# Patient Record
Sex: Female | Born: 1955 | Race: White | Hispanic: No | Marital: Married | Smoking: Former smoker
Health system: Southern US, Community
[De-identification: ages and names within clinical notes are randomized; demographics above are authoritative.]

## PROBLEM LIST (undated history)

## (undated) DIAGNOSIS — K5909 Other constipation: Secondary | ICD-10-CM

## (undated) DIAGNOSIS — J309 Allergic rhinitis, unspecified: Secondary | ICD-10-CM

## (undated) DIAGNOSIS — I809 Phlebitis and thrombophlebitis of unspecified site: Secondary | ICD-10-CM

## (undated) DIAGNOSIS — Z8739 Personal history of other diseases of the musculoskeletal system and connective tissue: Secondary | ICD-10-CM

## (undated) DIAGNOSIS — Z8701 Personal history of pneumonia (recurrent): Secondary | ICD-10-CM

## (undated) DIAGNOSIS — IMO0002 Reserved for concepts with insufficient information to code with codable children: Secondary | ICD-10-CM

## (undated) DIAGNOSIS — I82890 Acute embolism and thrombosis of other specified veins: Secondary | ICD-10-CM

## (undated) DIAGNOSIS — I8390 Asymptomatic varicose veins of unspecified lower extremity: Secondary | ICD-10-CM

## (undated) DIAGNOSIS — Z872 Personal history of diseases of the skin and subcutaneous tissue: Secondary | ICD-10-CM

## (undated) HISTORY — DX: Reserved for concepts with insufficient information to code with codable children: IMO0002

## (undated) HISTORY — DX: Other constipation: K59.09

## (undated) HISTORY — DX: Allergic rhinitis, unspecified: J30.9

## (undated) HISTORY — DX: Phlebitis and thrombophlebitis of unspecified site: I80.9

## (undated) HISTORY — DX: Personal history of diseases of the skin and subcutaneous tissue: Z87.2

## (undated) HISTORY — DX: Personal history of pneumonia (recurrent): Z87.01

## (undated) HISTORY — DX: Personal history of other diseases of the musculoskeletal system and connective tissue: Z87.39

## (undated) HISTORY — DX: Asymptomatic varicose veins of unspecified lower extremity: I83.90

## (undated) HISTORY — PX: GANGLION CYST EXCISION: SHX1691

## (undated) HISTORY — DX: Acute embolism and thrombosis of other specified veins: I82.890

## (undated) HISTORY — PX: PILONIDAL CYST EXCISION: SHX744

---

## 1998-08-27 ENCOUNTER — Encounter: Payer: Self-pay | Admitting: Family Medicine

## 1998-08-27 ENCOUNTER — Ambulatory Visit (HOSPITAL_COMMUNITY): Admission: RE | Admit: 1998-08-27 | Discharge: 1998-08-27 | Payer: Self-pay | Admitting: Family Medicine

## 1998-09-12 ENCOUNTER — Ambulatory Visit (HOSPITAL_COMMUNITY): Admission: RE | Admit: 1998-09-12 | Discharge: 1998-09-12 | Payer: Self-pay | Admitting: Family Medicine

## 1998-09-12 ENCOUNTER — Encounter: Payer: Self-pay | Admitting: Family Medicine

## 1998-11-12 ENCOUNTER — Other Ambulatory Visit: Admission: RE | Admit: 1998-11-12 | Discharge: 1998-11-12 | Payer: Self-pay | Admitting: Family Medicine

## 2001-08-08 ENCOUNTER — Ambulatory Visit: Admission: RE | Admit: 2001-08-08 | Discharge: 2001-08-08 | Payer: Self-pay | Admitting: Internal Medicine

## 2004-04-10 ENCOUNTER — Ambulatory Visit: Payer: Self-pay | Admitting: Internal Medicine

## 2004-11-19 ENCOUNTER — Other Ambulatory Visit: Admission: RE | Admit: 2004-11-19 | Discharge: 2004-11-19 | Payer: Self-pay | Admitting: Obstetrics and Gynecology

## 2006-03-23 ENCOUNTER — Other Ambulatory Visit: Admission: RE | Admit: 2006-03-23 | Discharge: 2006-03-23 | Payer: Self-pay | Admitting: Obstetrics and Gynecology

## 2006-09-02 ENCOUNTER — Ambulatory Visit: Payer: Self-pay | Admitting: Family Medicine

## 2007-04-28 ENCOUNTER — Telehealth: Payer: Self-pay | Admitting: Family Medicine

## 2007-04-28 ENCOUNTER — Emergency Department (HOSPITAL_COMMUNITY): Admission: EM | Admit: 2007-04-28 | Discharge: 2007-04-28 | Payer: Self-pay | Admitting: Emergency Medicine

## 2008-04-05 ENCOUNTER — Ambulatory Visit: Payer: Self-pay | Admitting: Family Medicine

## 2008-04-05 DIAGNOSIS — M79609 Pain in unspecified limb: Secondary | ICD-10-CM

## 2008-04-05 DIAGNOSIS — J309 Allergic rhinitis, unspecified: Secondary | ICD-10-CM | POA: Insufficient documentation

## 2008-05-01 ENCOUNTER — Telehealth: Payer: Self-pay | Admitting: Family Medicine

## 2008-05-02 ENCOUNTER — Encounter: Payer: Self-pay | Admitting: Family Medicine

## 2008-05-02 ENCOUNTER — Ambulatory Visit: Admission: RE | Admit: 2008-05-02 | Discharge: 2008-05-02 | Payer: Self-pay | Admitting: Family Medicine

## 2008-09-10 ENCOUNTER — Telehealth: Payer: Self-pay | Admitting: Family Medicine

## 2009-04-15 ENCOUNTER — Ambulatory Visit: Payer: Self-pay | Admitting: Family Medicine

## 2009-04-29 ENCOUNTER — Encounter: Payer: Self-pay | Admitting: Family Medicine

## 2009-04-29 ENCOUNTER — Telehealth: Payer: Self-pay | Admitting: Family Medicine

## 2009-05-01 ENCOUNTER — Encounter: Payer: Self-pay | Admitting: Family Medicine

## 2009-05-01 ENCOUNTER — Ambulatory Visit: Payer: Self-pay

## 2009-06-16 ENCOUNTER — Encounter: Payer: Self-pay | Admitting: Family Medicine

## 2009-06-16 ENCOUNTER — Ambulatory Visit: Payer: Self-pay | Admitting: Vascular Surgery

## 2009-09-14 ENCOUNTER — Emergency Department (HOSPITAL_COMMUNITY): Admission: EM | Admit: 2009-09-14 | Discharge: 2009-09-14 | Payer: Self-pay | Admitting: Emergency Medicine

## 2009-09-14 ENCOUNTER — Ambulatory Visit: Payer: Self-pay | Admitting: Vascular Surgery

## 2009-09-14 ENCOUNTER — Encounter (INDEPENDENT_AMBULATORY_CARE_PROVIDER_SITE_OTHER): Payer: Self-pay | Admitting: Emergency Medicine

## 2009-09-16 ENCOUNTER — Ambulatory Visit: Payer: Self-pay | Admitting: Vascular Surgery

## 2009-09-26 ENCOUNTER — Ambulatory Visit: Payer: Self-pay | Admitting: Family Medicine

## 2009-09-26 DIAGNOSIS — R0602 Shortness of breath: Secondary | ICD-10-CM

## 2009-09-26 DIAGNOSIS — R5383 Other fatigue: Secondary | ICD-10-CM

## 2009-09-26 DIAGNOSIS — R5381 Other malaise: Secondary | ICD-10-CM

## 2009-09-26 DIAGNOSIS — R079 Chest pain, unspecified: Secondary | ICD-10-CM

## 2009-11-10 ENCOUNTER — Ambulatory Visit: Payer: Self-pay | Admitting: Vascular Surgery

## 2009-11-17 ENCOUNTER — Ambulatory Visit: Payer: Self-pay | Admitting: Vascular Surgery

## 2010-01-19 ENCOUNTER — Encounter: Payer: Self-pay | Admitting: Family Medicine

## 2010-06-04 NOTE — Consult Note (Signed)
Summary: [facility]  [facility]   Imported By: Maryln Gottron 07/14/2009 13:10:32  _____________________________________________________________________  External Attachment:    Type:   Image     Comment:   External Document

## 2010-06-04 NOTE — Assessment & Plan Note (Signed)
Summary: R SIDE FLANK / BACK PAIN? // RS   Vital Signs:  Patient profile:   55 year old female Weight:      224 pounds O2 Sat:      95 % on Room air Temp:     98.7 degrees F oral BP sitting:   120 / 84  (left arm) Cuff size:   regular  Vitals Entered By: Raechel Ache, RN (Sep 26, 2009 11:13 AM)  O2 Flow:  Room air CC: ER f/u- not feeling better. C/o R-flank/back pain, SOB, nausea, sweating.   History of Present Illness: Here to discuss a myriad of symptoms including SOB, chest pains, nausea, sweating, and occasional bouts of slow heartbeats. She was in the ER on 09-14-09 for chest pains, and her entire workup was normal. This included labs, EKG, and even a chest CT angiogram. Nothing specific came up. She feels the same today, and she describes sharp right sided chest pains. She had a full endocrinologic workup last year per Dr. Dorisann Frames, and nothing showed up then either. She goes into great detail today to tell me how frustrated and angry she is that no one can find out why she feels so bad all the time. When I suggested a possible etiology like fibromyalgia, she angrily dismissed this as a possibility. She does have a psychiatric appt. next week to see Dr. Evelene Croon.   Allergies: 1)  ! Sulfa 2)  ! Codeine  Past History:  Past Medical History: Allergic rhinitis chronic venous insufficiency of left leg with superficial thrombophlebitis, sees Dr. Josephina Gip  Past Surgical History: pilonidal cyst ganglion cyst on hand  Review of Systems  The patient denies anorexia, fever, weight loss, weight gain, vision loss, decreased hearing, hoarseness, syncope, peripheral edema, prolonged cough, headaches, hemoptysis, abdominal pain, melena, hematochezia, severe indigestion/heartburn, hematuria, incontinence, genital sores, suspicious skin lesions, transient blindness, difficulty walking, depression, unusual weight change, abnormal bleeding, enlarged lymph nodes, angioedema, breast  masses, and testicular masses.    Physical Exam  General:  Well-developed,well-nourished,in no acute distress; alert,appropriate and cooperative throughout examination Neck:  No deformities, masses, or tenderness noted. Chest Wall:  No deformities, masses, or tenderness noted. Lungs:  Normal respiratory effort, chest expands symmetrically. Lungs are clear to auscultation, no crackles or wheezes. Heart:  Normal rate and regular rhythm. S1 and S2 normal without gallop, murmur, click, rub or other extra sounds. Abdomen:  Bowel sounds positive,abdomen soft and non-tender without masses, organomegaly or hernias noted. Neurologic:  alert & oriented X3, cranial nerves II-XII intact, and gait normal.   Psych:  Oriented X3, memory intact for recent and remote, normally interactive, poor eye contact, moderately anxious, and angry.     Impression & Recommendations:  Problem # 1:  FATIGUE (ICD-780.79)  Orders: Rheumatology Referral (Rheumatology)  Problem # 2:  CHEST PAIN (ICD-786.50)  Problem # 3:  SHORTNESS OF BREATH (ICD-786.05)  Complete Medication List: 1)  Multivitamins Tabs (Multiple vitamin) .Marland Kitchen.. 1 by mouth once daily 2)  Calcium Carbonate-vitamin D 600-400 Mg-unit Tabs (Calcium carbonate-vitamin d) .Marland Kitchen.. 1 by mouth once daily 3)  Magnesium 200 Mg Tabs (Magnesium) .Marland Kitchen.. 1 by mouth once daily 4)  Ibuprofen 800 Mg Tabs (Ibuprofen) .Marland Kitchen.. 1 q 6 hours as needed pain  Patient Instructions: 1)  I had a long discussion with her about how it can be difficult to find a simple diagnosis when someone has a wide constellation of symptoms. One area that should be investigated would be connective tissue disorders, and  she does mention that her father has rheumatoid arthritis. We will refer her to Rheumatology for this. She can use Ibuprofen in the meantime.

## 2010-06-04 NOTE — Letter (Signed)
Summary: [facility]   Imported By: Maryln Gottron 03/13/2010 10:54:19  _____________________________________________________________________  External Attachment:    Type:   Image     Comment:   External Document

## 2010-07-20 LAB — BASIC METABOLIC PANEL
BUN: 16 mg/dL (ref 6–23)
CO2: 28 mEq/L (ref 19–32)
Chloride: 105 mEq/L (ref 96–112)
Creatinine, Ser: 0.87 mg/dL (ref 0.4–1.2)
GFR calc Af Amer: >60 mL/min (ref >60)
Potassium: 3.9 mEq/L (ref 3.5–5.1)

## 2010-07-20 LAB — POCT CARDIAC MARKERS
CKMB, poc: <1 ng/mL — ABNORMAL LOW (ref 1.0–8.0)
Myoglobin, poc: 42.4 ng/mL (ref 12–200)
Troponin i, poc: <0.05 ng/mL (ref 0.00–0.09)

## 2010-07-20 LAB — DIFFERENTIAL
Basophils Relative: 1 % (ref 0–1)
Eosinophils Absolute: 0.1 10*3/uL (ref 0.0–0.7)
Eosinophils Relative: 2 % (ref 0–5)
Lymphs Abs: 2.4 10*3/uL (ref 0.7–4.0)
Monocytes Relative: 7 % (ref 3–12)

## 2010-07-20 LAB — CBC
HCT: 41.8 % (ref 36.0–46.0)
MCHC: 33.2 g/dL (ref 30.0–36.0)
MCV: 90.1 fL (ref 78.0–100.0)
RBC: 4.63 MIL/uL (ref 3.87–5.11)
WBC: 6.1 10*3/uL (ref 4.0–10.5)

## 2010-09-15 NOTE — Assessment & Plan Note (Signed)
OFFICE VISIT   ELONDA, GIULIANO  DOB:  10-Oct-1955                                       11/17/2009  ZOXWR#:[id number]   The patient had laser ablation of the left great saphenous vein as well  as the lateral anterolateral branch of her great saphenous vein in both  of which had severe reflux up to and including the saphenofemoral  junction.  She returns today having had some moderate aching discomfort  in the groin and thigh area as one would expect as well as some mild  calf discomfort.  She has had no distal edema.  She has had some  bruising at her puncture sites where a few stab phlebectomies were  performed in the distal thigh.   She denies any chest pain, dyspnea on exertion, PND, orthopnea or any  other new specific symptoms.  She is increasing her ambulation and  continues to wear elastic stocking on the left leg.   PHYSICAL EXAMINATION:  On exam today blood pressure 112/70, heart rate  72, respirations 16.  There is no distal edema in the left leg.  She has  3+ dorsalis pedis pulse.  There is some moderate ecchymosis in the  distal thigh where the stab phlebectomy wounds were performed as well as  some mild tenderness over the great saphenous vein proximally.   Venous duplex exam was ordered by me today, I reviewed and interpreted  this.  There is no evidence of deep venous obstruction.  She does have  total closure of the left great saphenous vein including the  anterolateral branch.   In general I think she is getting along quite well and she will wear her  stocking for one more week.  Return to see [place] on a p.r.n. basis.  She is  quite pleased with her results.     Quita Skye Hart [place], M.D.  Electronically Signed   JDL/MEDQ  D:  11/17/2009  T:  11/18/2009  Job:  [id number]

## 2010-09-15 NOTE — Consult Note (Signed)
NEW PATIENT CONSULTATION   Margaret, Livingston  DOB:  1955/11/16                                       06/16/2009  CHART#:[id number]   Margaret Livingston is 55 year old female referred by Dr. Clent Ridges for severe venous  insufficiency of the left leg with painful varicosities and distal  edema.  She states that she has been having symptoms for the past 13  years with increasing discomfort particularly in the calf and ankle area  after standing.  This is not relieved by elevation and her job does not  allow her to do that.  She works as an Wellsite geologist in  a Doctor, hospital.  She has no history of deep venous  thrombosis, but had some episodes of some nodules in the ankle area  which were tender, possibly with some localized thrombophlebitis  although not documented.  She has had no history stasis ulcers or  bleeding, but does have swelling in the ankle as the day progresses.  She has been wearing long-leg (pantyhose 8-15 mm compression)  compression stockings since November with no improvement.  She is unable  to take ibuprofen because of GI side effects but does take stone root  which is an herb with some mild improvement in her symptoms.  She states  that this is affecting her ability to work because of the discomfort the  more she stands on her feet and also to do normal activities around the  house.   PAST MEDICAL HISTORY:  Chronic medical problems:  No diabetes,  hypertension, coronary artery disease, COPD, or stroke.  Previous  surgery includes:  1. Ganglion cyst, right first metatarsal head.  2. Pilonidal cyst surgery.   FAMILY HISTORY:  Positive varicose veins in her grandmother.  Coronary  artery disease in grandfather.  Negative for stroke.   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  She quit smoking 13 years ago, drinks occasional  wine.   REVIEW OF SYSTEMS:  Denies any weight loss, chest  pain, dyspnea on  exertion, PND, orthopnea.  No pulmonary, GI, or GU symptoms.  Denies any  hemiparesis, aphasia, TIAs, stroke, musculoskeletal symptoms.  All other  systems are negative on 12-point review of systems.   PHYSICAL EXAM:  Blood pressure 133/81, heart rate 60, respirations 16.  General:  She is a healthy-appearing, well-developed, well-nourished  female who is in no apparent distress.  HEENT:  EOMs intact,  conjunctivae normal.  Neck is supple, 3+ carotid pulses palpable.  No  bruits.  No cervical adenopathy.  Lungs are clear to auscultation.  Cardiovascular exam:  Regular rhythm.  No murmurs.  Abdomen:  Soft,  nontender with no masses.  Musculoskeletal exam reveals no major  deformities.  Skin is free of rashes.  Neurologic exam is normal.  Lower  extremity exam reveals 3+ femoral, popliteal, and dorsalis pedis pulses  bilaterally.  The right leg is relatively unremarkable with a few small  varicosities in the calf.  The left leg has large bulging varicosities  in the mid to distal thigh medially as well as more varicosities at the  knee level medially over the great saphenous vein extending down into  the pretibial area with severe reticular and spider veins covering the  lower ankle area anteriorly and medially, and laterally.  There  are no  active ulcers.  There is 1+ edema.   Venous duplex exam performed today revealed gross reflux in the large  lateral accessory branch to the great saphenous vein extending up to the  saphenofemoral junction and communicating with this large nest of  varicosities which then communicates back with the great saphenous vein  at the knee.  The proximal great saphenous vein medially is free of  reflux.   She is having painful varicosities secondary to reflux in the great  saphenous system.  We will treat her initially with long-leg elastic  compression stockings (20 mm-30 mm gradient) as well as elevation and  analgesics that she can  tolerate.  She will return in 3 months.  If  there have been no improvement, I think she should have following  procedures:  1. Laser ablation of the large lateral accessory branch of the great      saphenous vein with multiple stab phlebectomies.  2. Two courses of sclerotherapy.  3. The patient will return in 3 months.     Quita Skye Hart [place], M.D.  Electronically Signed   JDL/MEDQ  D:  06/16/2009  T:  06/17/2009  Job:  [id number]   cc:   Jeannett Senior A. Clent Ridges, MD

## 2010-09-15 NOTE — Assessment & Plan Note (Signed)
OFFICE VISIT   Margaret Livingston, Margaret Livingston  DOB:  [birth year]                                       09/16/2009  ZOXWR#:[id number]   The patient returns for further evaluation of her venous insufficiency  of the left leg.  She continues to have significant discomfort in the  left ankle posterior to the left saphenous vein and was seen in the  emergency department recently with ultrasound which revealed some  resolving thrombophlebitis in her distal great saphenous vein  apparently.  She was also having some chest discomfort for a few days  and she thought she had had a pulmonary embolus but the CT scan did not  reveal that.  She has no history of DVT or evidence of recent DVT.  She  does continue to have pain in the ankle area as well as discomfort in  her bulging varicosities in the distal thigh.  She has worn long leg  elastic compression stockings for 3 months and has also tried elevation  and analgesics (ibuprofen) without improvement.  Her symptoms are  continuing to affect her daily living.  She has a documented gross  reflux in the large lateral accessory branch of the great saphenous vein  extending up to the saphenofemoral junction which communicates with a  large nest of varicosities in the thigh and back with the great  saphenous vein at the knee.   CHRONIC MEDICAL PROBLEMS:  No diabetes, hypertension, coronary artery  disease, COPD or stroke.  She has had previous surgery for ganglion cyst  in the right first metatarsal head.   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  Quit smoking 13 years ago.   REVIEW OF SYSTEMS:  Is negative for chest pain, dyspnea on exertion,  PND, orthopnea.  No hemiparesis, aphasia, amaurosis fugax.  Denies any  lower extremity symptoms other than the left leg.   PHYSICAL EXAM:  Vital signs:  Blood pressure is 121/74, heart rate 57,  temperature 98.  General:  This is a  well-developed, well-nourished  female in no apparent distress, alert and oriented x3.  Neck:  Supple,  3+ carotid pulses palpable.  Abdomen:  Soft, nontender with no masses.  Lower extremities:  Exam reveals bulging varicosities in the left distal  thigh medially.  She has some spider and reticular veins the lower third  of the leg and ankle on the left with some tenderness posterior to the  medial malleolus.  Saphenous vein is somewhat thickened at the ankle  level.  She has no distal edema, has 2-3+ dorsalis pedis pulse palpable.   Today I visualized this with a SonoSite and she does have a patent large  lateral accessory branch with reflux in the proximal third and half of  the thigh communicating with varicosities.  I offered her the option of  laser ablation of the left great saphenous vein with multiple stab  phlebectomies to help treat her painful varicosities in the thigh and  also sclerotherapy for the reticular and spider veins in the ankle area.  She does not want to proceed with that at the present time although she  is continuing to have symptoms.  She will return to see [place] if her  symptoms do not resolve.     Quita Skye Hart [place], M.D.  Electronically  Signed   JDL/MEDQ  D:  09/16/2009  T:  09/17/2009  Job:  [id number]

## 2010-09-15 NOTE — Assessment & Plan Note (Signed)
OFFICE VISIT   Margaret Livingston, Margaret Livingston  DOB:  March 03, 1956                                       11/10/2009  CHART#:[id number]   The patient had laser ablation of 2 vessels in the left leg today for  venous hypertension with painful varicosities.  She has reflux through a  large lateral anterior branch which communicates with the great  saphenous vein which also has significant reflux over the proximal 10 to  20 cm communicating at the saphenofemoral junction.  I visualized these  both on SonoSite and performed laser ablation of these 2 separate  vessels today without complication.  We also performed 3 stab  phlebectomies of the distal thigh of large bulging varicosities.  The  patient tolerated the procedure well and will return in 1 week for  venous duplex exam to confirm closure of the great saphenous system and  the anterolateral accessory branch.     Quita Skye Hart [place], M.D.  Electronically Signed   JDL/MEDQ  D:  11/10/2009  T:  11/11/2009  Job:  [id number]

## 2010-09-15 NOTE — Procedures (Signed)
LOWER EXTREMITY VENOUS REFLUX EXAM   INDICATION:  Left lower extremity pain and swelling.   EXAM:  Using color-flow imaging and pulse Doppler spectral analysis, the  left common femoral, superficial femoral, popliteal, posterior tibial,  greater and lesser saphenous veins are evaluated.  There left is  evidence suggesting deep venous insufficiency throughout the left  femoral popliteal venous system.   The left saphenofemoral junction is not competent with reflux of >500  milliseconds.  The left GSV is not competent with reflux of >500  milliseconds.   The left proximal short saphenous vein demonstrates competency.   GSV Diameter (used if found to be incompetent only)                                            Right    Left  Proximal Greater Saphenous Vein           cm       0.34 cm  Proximal-to-mid-thigh                     cm       0.3 cm  Mid thigh                                 cm       cm  Mid-distal thigh                          cm       cm  Distal thigh                              cm       0.52 cm  Knee                                      cm       0.54 cm   IMPRESSION:  1. Left greater saphenous vein reflux of >500 milliseconds is      identified as described above and on the attached worksheet.  2. The left greater saphenous vein is tortuous at the mid to distal      thigh level.  3. The left deep venous system is not competent with reflux of >500      milliseconds.  4. The left lesser saphenous vein is competent.          ___________________________________________  Quita Skye. Hart [place], M.D.   CH/MEDQ  D:  06/17/2009  T:  06/18/2009  Job:  [id number]

## 2010-09-15 NOTE — Procedures (Signed)
DUPLEX DEEP VENOUS EXAM - LOWER EXTREMITY   INDICATION:  Follow up 1 week status post greater saphenous vein  ablation.   HISTORY:  Edema:  no  Trauma/Surgery:  Left greater saphenous vein ablation  Pain:  yes  PE:  no  Previous DVT:  no  Anticoagulants:  no  Other:   DUPLEX EXAM:                CFV   SFV   PopV  PTV    GSV                R  L  R  L  R  L  R   L  R  L  Thrombosis    0  0     0     0      0     +  Spontaneous   +  +     +     +      +     0  Phasic        +  +     +     +      +     0  Augmentation  +  +     +     +      +     0  Compressible  +  +     +     +      +     0  Competent     +  +     +     +      +     0   Legend:  + - yes  o - no  p - partial  D - decreased   IMPRESSION:  There does not appear to be any evidence of deep vein  thrombosis noted in the left leg.  The left greater saphenous vein is  ablated proximal to mid thigh.    _____________________________  Quita Skye. Hart [place], M.D.   CB/MEDQ  D:  11/17/2009  T:  11/17/2009  Job:  [id number]

## 2010-09-18 NOTE — Assessment & Plan Note (Signed)
OFFICE VISIT   LATRICE, STORLIE  DOB:  21-Feb-1956                                       10/06/2009  EAVWU#:[id number]   ADDENDUM:  Addendum to the previous note of Sep 16, 2009.   The patient is a 55 year old female who I have evaluated for painful  varicosities in the left thigh in February of this year, referred by Dr.  Clent Ridges for venous insufficiency.  She returned in May and had documented  gross reflux in the large lateral branch of the left great saphenous  vein feeding a large nest of varicosities in the thigh as well as some  other smaller varicosities extending posteriorly into the thigh and  calf.  She has continued to have symptoms despite wearing long-leg  elastic compression stockings and trying elevation and pain medication  and is now ready to proceed with treatment for these painful  varicosities to eliminate her symptoms which are affecting her daily  living.  She was noted previously to have no deep venous obstruction and  had 2 to 3+ dorsalis pedis pulses palpable.   Because she has failed conservative management, I would recommend  proceeding with laser ablation of the left large lateral accessory  branch of the great saphenous vein with multiple stab phlebectomies to  be followed by two courses of sclerotherapy for the secondary smaller  varicosities and reticular veins.  We will proceed with precertification  for this to be performed in the near future.     Quita Skye Hart [place], M.D.  Electronically Signed   JDL/MEDQ  D:  10/06/2009  T:  10/07/2009  Job:  [id number]

## 2010-11-02 ENCOUNTER — Encounter (INDEPENDENT_AMBULATORY_CARE_PROVIDER_SITE_OTHER): Payer: Self-pay | Admitting: Vascular Surgery

## 2010-11-02 ENCOUNTER — Encounter (INDEPENDENT_AMBULATORY_CARE_PROVIDER_SITE_OTHER): Payer: BC Managed Care – PPO

## 2010-11-02 DIAGNOSIS — I83893 Varicose veins of bilateral lower extremities with other complications: Secondary | ICD-10-CM

## 2010-11-03 NOTE — Assessment & Plan Note (Signed)
OFFICE VISIT  [name], [name] DOB:  05/25/1955                                       11/02/2010 [id number]#:[id number]  The patient has been seen before in the office having undergone laser ablation in July of 2011 of not only her left great saphenous vein but also an anterolateral branch.  A duplex exam 1 week later revealed closure of both branches in the mid to proximal thigh and no DVT.  A few months ago she began having some aching discomfort behind the left knee and also occasionally cramping in the left calf with ambulation and also at night.  She also had developed more prominent spider and reticular veins in the lower thigh.  She has no history of DVT or thrombophlebitis.  CHRONIC MEDICAL PROBLEMS:  None.  Denies diabetes, hypertension, coronary artery disease, COPD or stroke.  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  PHYSICAL EXAMINATION:  Vital signs:  Blood pressure is 105/70, heart rate is 56, respirations 12.  General:  She is a well-developed, well- nourished female who is in no apparent distress, alert and oriented x3. HEENT:  Exam normal for age.  Lower extremities:  Exam reveals 3+ femoral, popliteal and dorsalis pedis pulses palpable bilaterally.  Left leg has no bulging varicosities.  There is a large pattern of reticular and spider veins in the distal thigh medially.  No distal edema is noted.  There is no hyperpigmentation or ulceration.  Today I ordered a lower extremity venous duplex exam on the left which I reviewed and interpreted.  There is no DVT.  The left GSV is small and has no reflux.  The superficial femoral and popliteal veins in the deep system are incompetent.  There is a branch off of the distal great saphenous vein at the thigh level which gives off some varicose branches and the small saphenous vein is competent.  I do not think she needs  any further laser treatment but the only treatment at this point would be sclerotherapy of the secondary reticular and spider veins.  Margaret Livingston will get in touch with her next week when she returns to discuss the specifics of this and the patient will then make a decision about whether to proceed.    Margaret Livingston, M.D. Electronically Signed  JDL/MEDQ  D:  11/02/2010  T:  11/03/2010  Job:  [id number]

## 2010-11-09 NOTE — Procedures (Unsigned)
DUPLEX DEEP VENOUS EXAM - LOWER EXTREMITY  INDICATION:  Followup left GSV ablation 1 year ago, now with pain and swelling.  HISTORY:  Edema:  Yes. Trauma/Surgery:  GSV ablation. Pain:  Yes. PE:  No. Previous DVT:  No. Anticoagulants:  No. Other:  DUPLEX EXAM:               CFV   SFV   PopV  PTV    GSV               R  L  R  L  R  L  R   L  R  L Thrombosis       o     o     o      o     o Spontaneous      +     +     +            o Phasic           +     +     +            o Augmentation     +     +     +      +     + Compressible     +     +     +      +     + Competent        +     o     o      +     +  Legend:  + - yes  o - no  p - partial  D - decreased  IMPRESSION: 1. No evidence of deep venous thrombosis in the left lower extremity. 2. Left greater saphenous vein appears patent but small and without     reflux. 3. Left superficial femoral vein and popliteal veins are incompetent. 4. There is a large branch of the mid thigh greater saphenous vein     which gives off multiple varicose branches. 5. Left small saphenous vein appears competent.   _____________________________ Quita Skye. Hart [place], M.D.  LT/MEDQ  D:  11/02/2010  T:  11/02/2010  Job:  [id number]

## 2010-11-12 ENCOUNTER — Ambulatory Visit (INDEPENDENT_AMBULATORY_CARE_PROVIDER_SITE_OTHER): Payer: BC Managed Care – PPO

## 2010-11-12 DIAGNOSIS — I781 Nevus, non-neoplastic: Secondary | ICD-10-CM

## 2013-05-31 ENCOUNTER — Other Ambulatory Visit: Payer: Self-pay | Admitting: *Deleted

## 2013-05-31 ENCOUNTER — Telehealth: Payer: Self-pay | Admitting: *Deleted

## 2013-05-31 DIAGNOSIS — M79609 Pain in unspecified limb: Secondary | ICD-10-CM

## 2013-05-31 DIAGNOSIS — I83893 Varicose veins of bilateral lower extremities with other complications: Secondary | ICD-10-CM

## 2013-05-31 NOTE — Telephone Encounter (Signed)
Margaret Livingston c/o left leg pain for 3 days. She states that inner aspect of left ankle and inner left thigh is "painful to touch" and is "a little swollen".  Denies redness or warmth over these areas.  Denies falls/accidents/blows to leg.  States she is having difficulty sleeping on her side to to leg pain.  Appointment made for 06-04-2013 for 3:30P (lab)/ 4:00P Dr. Hart RochesterLawson.

## 2013-06-01 ENCOUNTER — Encounter: Payer: Self-pay | Admitting: Vascular Surgery

## 2013-06-04 ENCOUNTER — Ambulatory Visit (HOSPITAL_COMMUNITY)
Admission: RE | Admit: 2013-06-04 | Discharge: 2013-06-04 | Disposition: A | Discharge status: Home / Self Care | Payer: 59 | Source: Ambulatory Visit | Attending: Vascular Surgery | Admitting: Vascular Surgery

## 2013-06-04 ENCOUNTER — Encounter: Payer: Self-pay | Admitting: Vascular Surgery

## 2013-06-04 ENCOUNTER — Ambulatory Visit (INDEPENDENT_AMBULATORY_CARE_PROVIDER_SITE_OTHER): Payer: 59 | Admitting: Vascular Surgery

## 2013-06-04 VITALS — BP 148/88 | HR 62 | Resp 16 | Ht 70.0 in | Wt 238.0 lb

## 2013-06-04 DIAGNOSIS — I82819 Embolism and thrombosis of superficial veins of unspecified lower extremities: Secondary | ICD-10-CM | POA: Insufficient documentation

## 2013-06-04 DIAGNOSIS — M79609 Pain in unspecified limb: Secondary | ICD-10-CM

## 2013-06-04 DIAGNOSIS — Z9889 Other specified postprocedural states: Secondary | ICD-10-CM | POA: Insufficient documentation

## 2013-06-04 DIAGNOSIS — I83893 Varicose veins of bilateral lower extremities with other complications: Secondary | ICD-10-CM | POA: Insufficient documentation

## 2013-06-04 DIAGNOSIS — I8000 Phlebitis and thrombophlebitis of superficial vessels of unspecified lower extremity: Secondary | ICD-10-CM

## 2013-06-04 DIAGNOSIS — I8002 Phlebitis and thrombophlebitis of superficial vessels of left lower extremity: Secondary | ICD-10-CM

## 2013-06-04 NOTE — Progress Notes (Signed)
Subjective:     Patient ID: Margaret Livingston, female   DOB: 10-28-1955, 58 y.o.   MRN: [id number]  HPI this [age] year old female returns for evaluation of a painful area in the left thigh which occurred one week ago. She has previously undergone laser ablation of left great saphenous vein and anterior accessory branch of left great saphenous vein and 2011 followed by some sclerotherapy and 2012. There has not been much change in the leg since that time she states. She developed a tender nodular area in the lower thigh and is seen today to rule out venous insufficiency. She has had some chronic edema in the left leg and has had some persistent spider and reticular veins in the ankle area and thigh area. She has no history of stasis ulcers bleeding DVT or superficial thrombophlebitis in the past.  Past Medical History  Diagnosis Date  . Degenerative disc disease   . Varicose veins     History  Substance Use Topics  . Smoking status: Former Smoker    Quit date: 05/03/1996  . Smokeless tobacco: Not on file  . Alcohol Use: Not on file    History reviewed. No pertinent family history.  Allergies  Allergen Reactions  . Codeine   . Sulfonamide Derivatives     No current outpatient prescriptions on file.  BP 148/88  Pulse 62  Resp 16  Ht 5\' 10"  (1.778 m)  Wt 238 lb (107.956 kg)  BMI 34.15 kg/m2  Body mass index is 34.15 kg/(m^2).           Review of Systems denies chest pain, dyspnea on exertion, PND, orthopnea. Does complain of left leg swelling and pain in the legs when walking. Other systems negative and complete review of systems    Objective:   Physical Exam BP 148/88  Pulse 62  Resp 16  Ht 5\' 10"  (1.778 m)  Wt 238 lb (107.956 kg)  BMI 34.15 kg/m2 Gen.-alert and oriented x3 in no apparent distress HEENT normal for age Lungs no rhonchi or wheezing Cardiovascular regular rhythm no murmurs carotid pulses 3+ palpable no bruits audible Abdomen soft nontender no palpable  masses Musculoskeletal free of  major deformities Skin clear -no rashes Neurologic normal Lower extremities 3+ femoral and dorsalis pedis pulses palpable bilaterally with no edema right leg 1+ edema left leg Palpable thrombosed varicosities in the left medial distal thigh area over about a 5 cm in circumference area. Prominent reticular veins in the lower third left leg around the medial malleolus. No calf tenderness.  Today I ordered a venous duplex exam left leg which are reviewed and interpreted. There is no DVT. She does have superficial thrombophlebitis with partially occlusive thrombus in these varicosities in the left medial distal thigh. There is gross reflux in the great saphenous vein from the knee to the mid thigh which is significant. Proximal to this the vein does not have reflux and is of small caliber. There is some reflux [facility] on the left.       Assessment:     Acute thrombophlebitis left thigh varicosities about one week ago-now resolving Areas of significant reflux and left great saphenous vein particularly distal thigh    Plan:     Would treat thrombophlebitis with heat and anti-inflammatory drugs   if continues to have pain or develops new varicosities she will be in touch with us in the future

## 2013-07-20 ENCOUNTER — Other Ambulatory Visit: Payer: Self-pay | Admitting: Obstetrics & Gynecology

## 2013-12-21 ENCOUNTER — Encounter: Payer: Self-pay | Admitting: Vascular Surgery

## 2013-12-24 ENCOUNTER — Encounter: Payer: Self-pay | Admitting: Vascular Surgery

## 2013-12-24 ENCOUNTER — Ambulatory Visit (INDEPENDENT_AMBULATORY_CARE_PROVIDER_SITE_OTHER): Payer: 59 | Admitting: Vascular Surgery

## 2013-12-24 VITALS — BP 140/85 | HR 64 | Resp 16 | Ht 70.0 in | Wt 235.0 lb

## 2013-12-24 DIAGNOSIS — I8000 Phlebitis and thrombophlebitis of superficial vessels of unspecified lower extremity: Secondary | ICD-10-CM

## 2013-12-24 DIAGNOSIS — I8002 Phlebitis and thrombophlebitis of superficial vessels of left lower extremity: Secondary | ICD-10-CM

## 2013-12-24 NOTE — Progress Notes (Signed)
Subjective:     Patient ID: Margaret Livingston, female   DOB: 08-24-1955, 58 y.o.   MRN: [id number]  HPI this 58 year old female returns for continued followup regarding her painful varicosities in the left leg. She previously underwent laser ablation of the left great saphenous and anterior accessory branch in 2011. She did well for a while but recently developed recurrent varicosities and had an episode of thrombophlebitis in the distal left medial thigh and was seen by me in February of 2015. She was found to have gross reflux in the remaining saphenous vein from the distal calf to the mid thigh level with a large caliber by. She has been treated with long-leg elastic compression stockings 20-30 mm gradient as well as elevation and ibuprofen since that time. She has had progressive pain and now last week developed a second episode of thrombophlebitis in the lower third of the left leg. She has no history of DVT.  Past Medical History  Diagnosis Date  . Degenerative disc disease   . Varicose veins   . Varicose veins   . Superficial vein thrombosis   . Thrombophlebitis   . DDD (degenerative disc disease)   . Hx of pilonidal cyst   . History of ganglion cyst   . Chronic constipation   . Allergic rhinitis   . H/O: pneumonia     History  Substance Use Topics  . Smoking status: Former Smoker -- 10 years    Quit date: 05/03/2008  . Smokeless tobacco: Not on file  . Alcohol Use: 0.6 oz/week    1 Glasses of wine per week     Comment: per month    Family History  Problem Relation Age of Onset  . Osteoarthritis Mother   . Hypothyroidism Mother   . Rheum arthritis Father   . Mental retardation Brother   . Heart attack Maternal Uncle     due to pimples on kidneys  . Stroke Maternal Grandmother   . Diabetes Mellitus II Maternal Grandmother   . Heart attack Maternal Grandfather   . Varicose Veins Paternal Grandmother   . Stroke Paternal Grandfather   . Diabetes Mellitus II Paternal  Grandfather   . Hypertension Paternal Grandfather     Allergies  Allergen Reactions  . Codeine   . Darvon [Propoxyphene] Nausea Only    darvocet  . Ketorolac Tromethamine Other (See Comments)    aggressive  . Sulfonamide Derivatives     Current outpatient prescriptions:B Complex Vitamins (B COMPLEX PO), Take by mouth as directed., Disp: , Rfl: ;  Misc Natural Products (COLON CLEANSER PO), Take by mouth., Disp: , Rfl:   BP 140/85  Pulse 64  Resp 16  Ht  (1.778 m)  Wt 235 lb (106.595 kg)  BMI 33.72 kg/m2  Body mass index is 33.72 kg/(m^2).           Review of Systems denies chest pain, dyspnea on exertion, PND, orthopnea, hemoptysis     Objective:   Physical Exam BP 140/85  Pulse 64  Resp 16  Ht  (1.778 m)  Wt 235 lb (106.595 kg)  BMI 33.72 kg/m2  General well-developed well-nourished female no apparent chest alert and oriented x3 Lungs no rhonchi or wheezing Left leg with bulging varicosities in the distal medial 5 where the previous thrombophlebitis occurred. New thrombophlebitis in the lower third of the leg over the great saphenous vein extending down to the medial malleolus with abundant reticular veins and 1+ chronic edema. 3+ dorsalis  pedis pulse palpable.  I reviewed the venous duplex exam which we performed in February of this year which does confirm that she has gross reflux throughout the left great saphenous vein from the distal calf to the proximal thigh and there is also reflux at the junction of the left saphenofemoral area     Assessment:     Recurrent varicosities left thigh with 2 episodes of thrombophlebitis in last 6 months do to gross reflux left great saphenous vein The symptoms have been resistant to conservative measures including long-leg elastic compression stockings 20-30 mm gradient, elevation, and ibuprofen. In fact she has developed a second episode of thrombophlebitis while trying conservative measures    Plan:      Would recommend laser ablation of remaining left great saphenous vein which is a large vein with gross reflux and 10-20 stab phlebectomy of painful varicosities a single procedure We'll proceed with  preauthorization to perform this in the near future

## 2013-12-25 ENCOUNTER — Other Ambulatory Visit: Payer: Self-pay | Admitting: Geriatric Medicine

## 2013-12-25 DIAGNOSIS — R109 Unspecified abdominal pain: Secondary | ICD-10-CM

## 2013-12-25 DIAGNOSIS — R1084 Generalized abdominal pain: Secondary | ICD-10-CM

## 2014-01-01 ENCOUNTER — Ambulatory Visit
Admission: RE | Admit: 2014-01-01 | Discharge: 2014-01-01 | Disposition: A | Discharge status: Home / Self Care | Payer: 59 | Source: Ambulatory Visit | Attending: Geriatric Medicine | Admitting: Geriatric Medicine

## 2014-01-01 DIAGNOSIS — R109 Unspecified abdominal pain: Secondary | ICD-10-CM

## 2014-01-01 DIAGNOSIS — R1084 Generalized abdominal pain: Secondary | ICD-10-CM

## 2014-01-01 MED ORDER — IOHEXOL 300 MG/ML  SOLN
125.0000 mL | Freq: Once | INTRAMUSCULAR | Status: AC | PRN
  Administered 2014-01-01: 125 mL via INTRAVENOUS

## 2014-01-02 ENCOUNTER — Other Ambulatory Visit: Payer: 59

## 2014-12-30 IMAGING — CT CT ABD-PELV W/ CM
2 of 5 series · 16 of 46 positions shown, 18 images · IV contrast (READICAT/WATER & [ID] OMNI 300)
Comparison: MRI of the lumbar spine February 25, 2012 and CT of the

CLINICAL DATA: Right flank pain, episodic epigastric and right
lower quadrant pain. Weight gain, constipation.

EXAM:
CT ABDOMEN AND PELVIS WITH CONTRAST
TECHNIQUE: Multidetector CT imaging of the abdomen and pelvis was performed
using the standard protocol following bolus administration of
intravenous contrast.
CONTRAST:  125mL OMNIPAQUE IOHEXOL 300 MG/ML  SOLN

[Series 2: abd/pelvis with · axial · 0.82mm/px · z∈[-400,+5]mm · 13 of 93 slices shown, 15 images]
[im 6/93  soft-tissue]
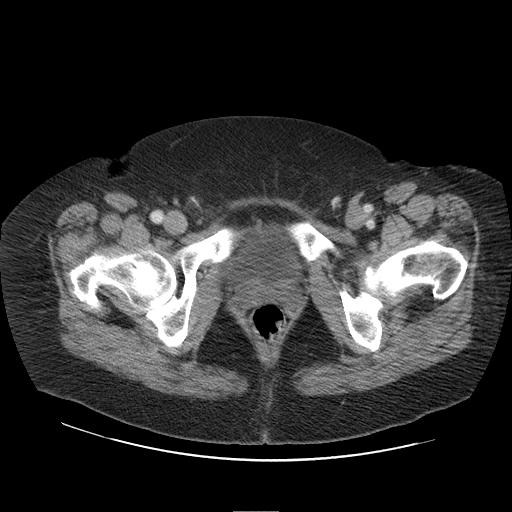
[im 6/93  bone]
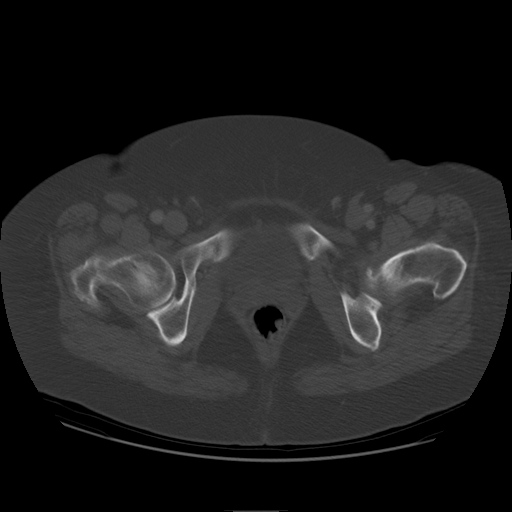
[im 11/93  soft-tissue]
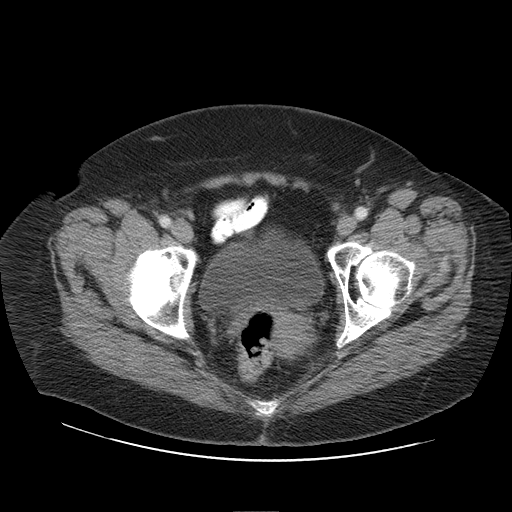
[im 21/93  soft-tissue]
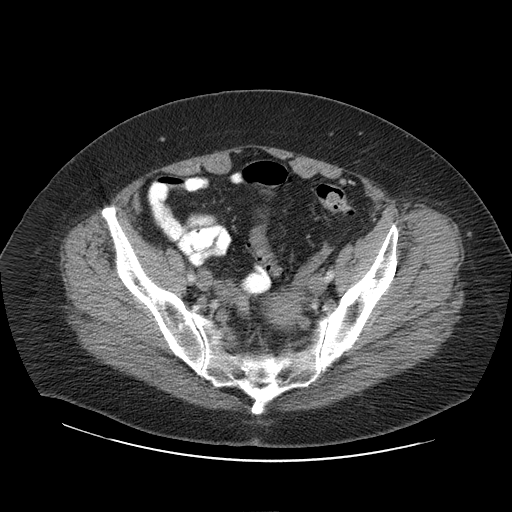
[im 26/93  soft-tissue]
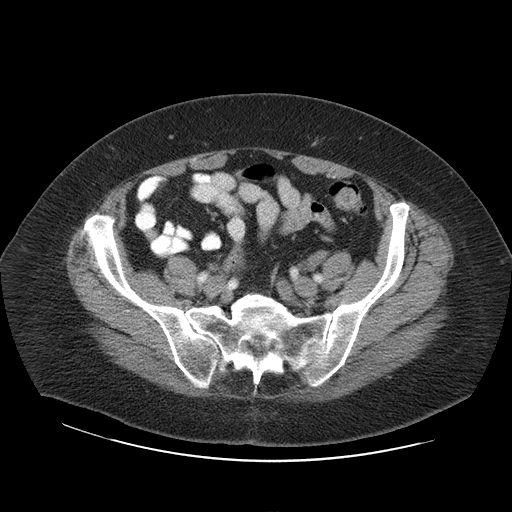
[im 31/93  soft-tissue]
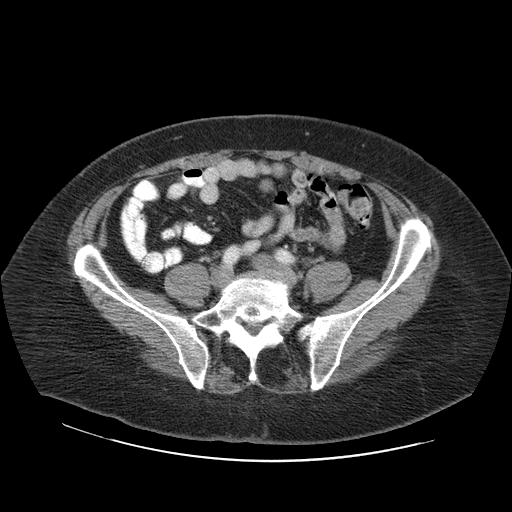
[im 41/93  soft-tissue]
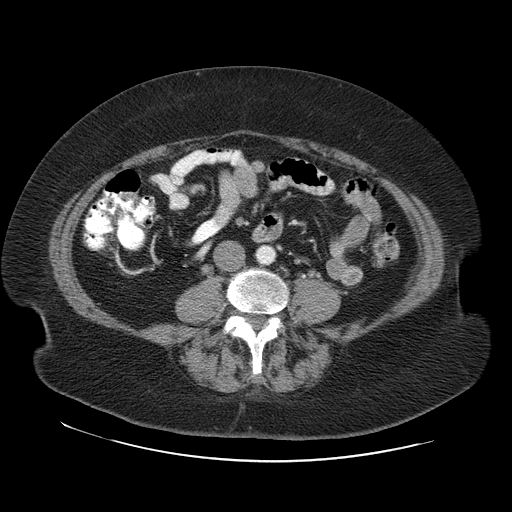
[im 47/93  soft-tissue]
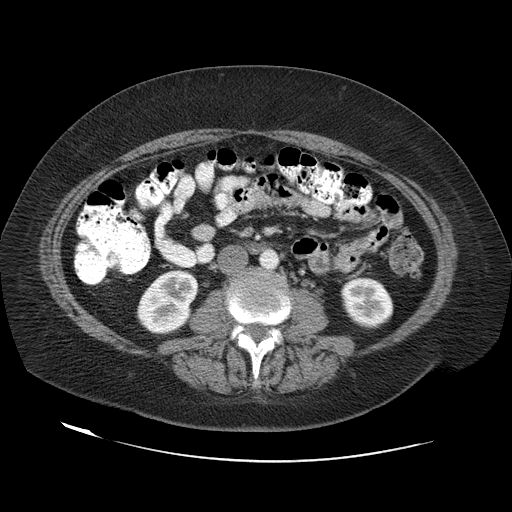
[im 52/93  soft-tissue]
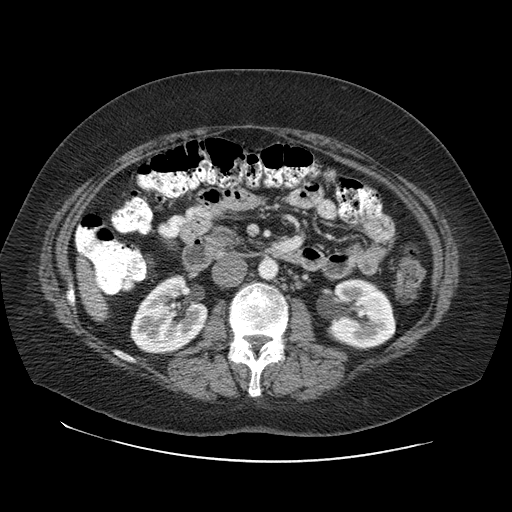
[im 62/93  soft-tissue]
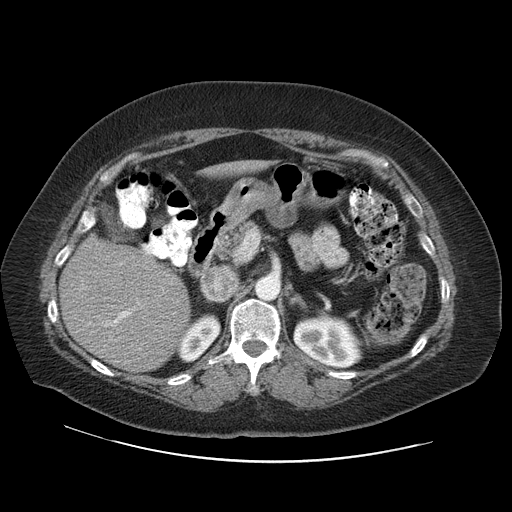
[im 62/93  bone]
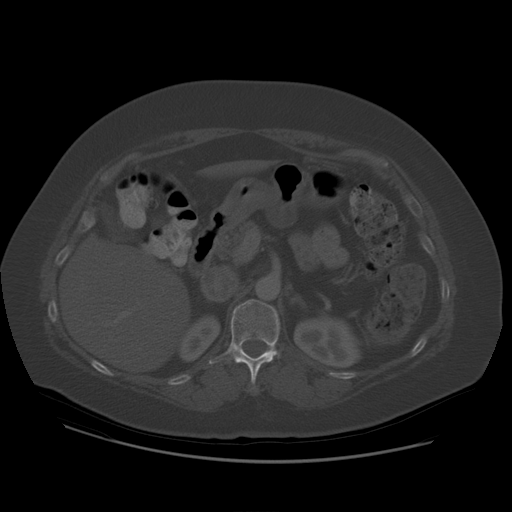
[im 67/93  soft-tissue]
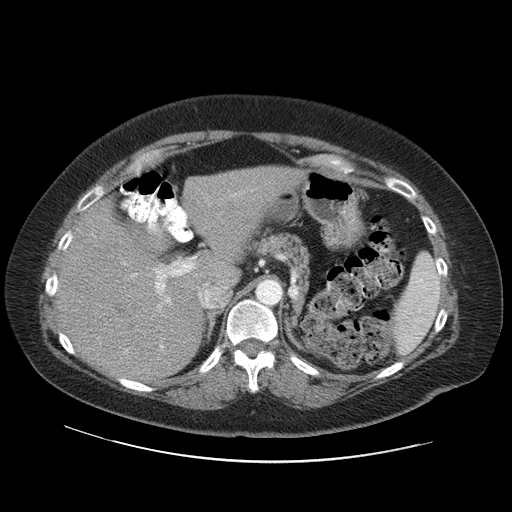
[im 72/93  soft-tissue]
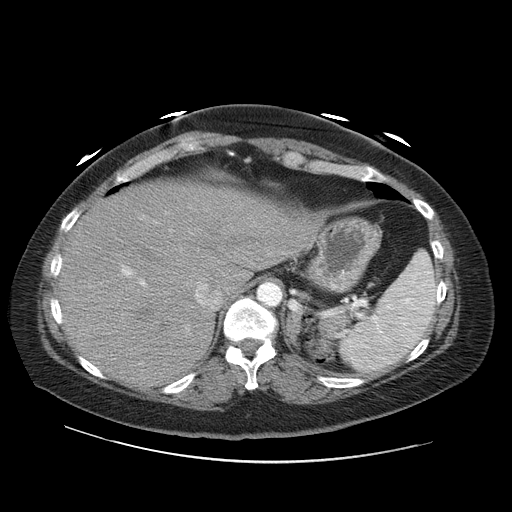
[im 82/93  soft-tissue]
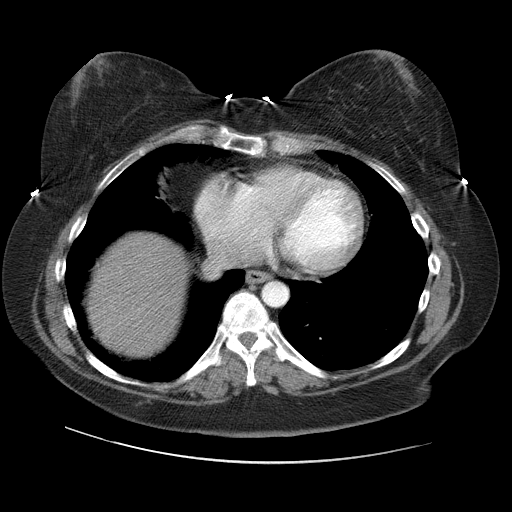
[im 87/93  soft-tissue]
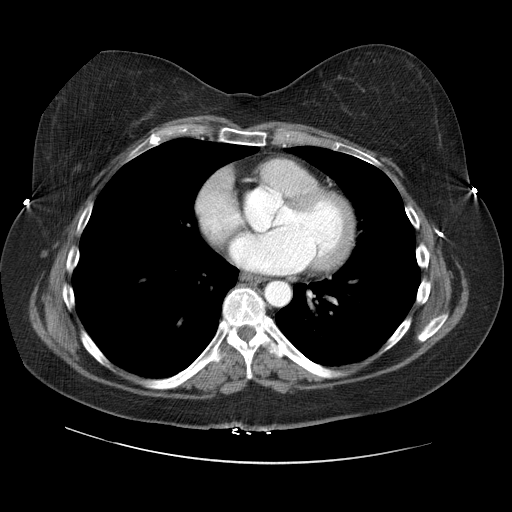

[Series 400: cor · coronal · 1.01mm/px · 3 of 140 slices shown]
[im 47/140  soft-tissue]
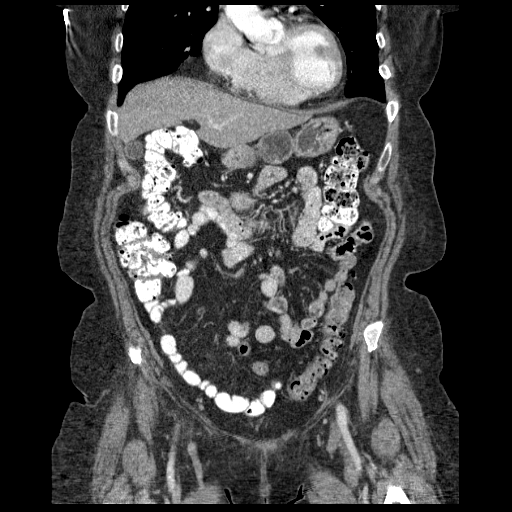
[im 62/140  soft-tissue]
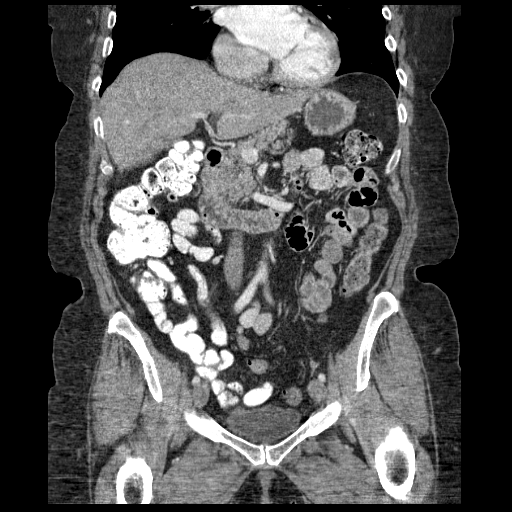
[im 78/140  soft-tissue]
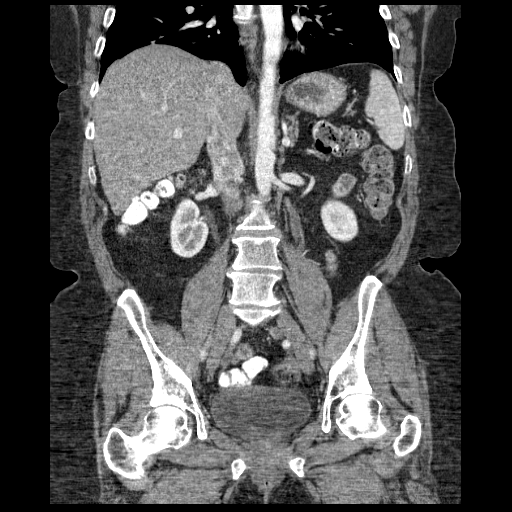

[16 of 46 positions shown; findings below may reference images not displayed]

FINDINGS: LUNG BASES: Included view of the lung bases are clear. Visualized
heart and pericardium are unremarkable. Linear radiopaque foreign
body/wire within the included left chest subcutaneous fat partially
imaged.

SOLID ORGANS: The spleen, gallbladder, pancreas and adrenal glands
are unremarkable. 13 mm cyst and 10 mm cyst right lobe of the liver.
The liver is otherwise unremarkable.

GASTROINTESTINAL TRACT: The stomach, small and large bowel are
normal in course and caliber without inflammatory changes. Mild
sigmoid diverticulosis. Mild amount of retained large bowel stool.
Normal appendix.

KIDNEYS/ URINARY TRACT: Kidneys are orthotopic, demonstrating
symmetric enhancement. No nephrolithiasis, hydronephrosis or solid
renal masses. The unopacified ureters are normal in course and
caliber. Delayed imaging through the kidneys demonstrates symmetric
prompt contrast excretion within the proximal urinary collecting
system. Urinary bladder is partially distended and unremarkable.

PERITONEUM/RETROPERITONEUM: No intraperitoneal free fluid nor free
air. Aortoiliac vessels are normal in course and caliber. No
lymphadenopathy by CT size criteria. Internal reproductive organs
are unremarkable.

SOFT TISSUE/OSSEOUS STRUCTURES: Nonsuspicious. Mild degenerative
change of the hips. Lumbar spondylosis better seen on prior MRI.
Small fat containing umbilical hernia.
IMPRESSION: No acute intra-abdominal or pelvic process.

Mild amount of retained large bowel stool. Sigmoid diverticulosis
without CT findings of acute diverticulitis.

  By: Rachel Lara

## 2017-06-17 ENCOUNTER — Other Ambulatory Visit: Payer: Self-pay | Admitting: Acute Care

## 2017-06-17 DIAGNOSIS — I619 Nontraumatic intracerebral hemorrhage, unspecified: Secondary | ICD-10-CM

## 2017-06-27 ENCOUNTER — Ambulatory Visit: Payer: Self-pay
